# Patient Record
Sex: Male | Born: 2000 | Race: White | Hispanic: No | Marital: Single | Smoking: Never smoker
Health system: Southern US, Community
[De-identification: ages and names within clinical notes are randomized; demographics above are authoritative.]

## PROBLEM LIST (undated history)

## (undated) DIAGNOSIS — Z8489 Family history of other specified conditions: Secondary | ICD-10-CM

## (undated) DIAGNOSIS — Z969 Presence of functional implant, unspecified: Secondary | ICD-10-CM

## (undated) HISTORY — PX: WISDOM TOOTH EXTRACTION: SHX21

---
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
## 2021-09-06 ENCOUNTER — Encounter: Payer: Self-pay | Admitting: Family

## 2021-09-06 ENCOUNTER — Ambulatory Visit (INDEPENDENT_AMBULATORY_CARE_PROVIDER_SITE_OTHER): Payer: Self-pay | Admitting: Family

## 2021-09-06 VITALS — BP 113/72 | HR 60 | Temp 98.4°F | Ht 73.0 in | Wt 197.2 lb

## 2021-09-06 DIAGNOSIS — R21 Rash and other nonspecific skin eruption: Secondary | ICD-10-CM | POA: Insufficient documentation

## 2021-09-06 DIAGNOSIS — L729 Follicular cyst of the skin and subcutaneous tissue, unspecified: Secondary | ICD-10-CM

## 2021-09-06 NOTE — Patient Instructions (Signed)
Welcome to [facility], It was a pleasure meeting you today! ? ?As discussed, I feel confident the lump under your left arm is a benign fluid-filled cyst. These are not harmful. If you notice it growing, becoming tender, or discolored, let me know and I will refer you to Dermatology to have drained, or you can call a Dermatology office on your own if your insurance does not require a referral.  ? ? ?Have a great weekend :-) ? ? ? ? ? ?PLEASE NOTE: ?If you had any LAB tests please let us know if you have not heard back within a few days. You may see your results on MyChart before we have a chance to review them but we will give you a call once they are reviewed by [place]. If we ordered any REFERRALS today, please let us know if you have not heard from their office within the next week.  ?Let us know through MyChart if you are needing REFILLS, or have your pharmacy send [place] the request. You can also use MyChart to communicate with me or any office staff. ? ? ? ?

## 2021-09-06 NOTE — Progress Notes (Signed)
? ?  New Patient Office Visit ? ?Subjective:  ?Patient ID: Eugene Fox, male    DOB: 08/17/2000  Age: 21 y.o. MRN: [id number] ? ?CC:  ?Chief Complaint  ?Patient presents with  ? Establish Care  ? Rash  ?  Pt c/o rash under left arm. Present for about a year.   ? ? ?HPI ?Eugene Fox presents for establishing care today. ? ?Skin Lesion: Patient complains of a skin lesion of the upper extremity. The lesion has been present for 1 year. Lesion has not changed in size in that time. Symptoms associated with the lesion are: none. Patient denies increasing diameter, increasing number of lesions, darkening color, itching, bleeding, tendency to be traumatized, unsightliness, pain, or drainage.  ? ? ?Assessment & Plan:  ? ?Problem List Items Addressed This Visit   ?None ?Visit Diagnoses   ? ? Benign cyst of skin    -  Primary ?appears as a non-movable firm cyst in subcutaneous skin on underside of left upper arm. Not visible above skin, non-tender to palpation, no discoloration. Advised pt to monitor, and let me know if noticing it growing, becoming tender, or discolored, and I will refer you to Dermatology to have assessed/drained.  ? ?  ? ?No outpatient medications prior to visit.  ? ?No facility-administered medications prior to visit.  ? ? ?Past Medical History:  ?Diagnosis Date  ? Family history of adverse reaction to anesthesia   ? pt's mother has hx. of post-op nausea  ? Retained orthopedic hardware 08/2016  ? right ankle  ? ? ?Past Surgical History:  ?Procedure Laterality Date  ? HARDWARE REMOVAL Right 09/11/2016  ? Procedure: Right ankle removal of deep implants;  Surgeon: Wylene Simmer, MD;  Location: [facility];  Service: Orthopedics;  Laterality: Right;  ? ORIF TIBIA FRACTURE Right 05/22/2016  ? Procedure: OPEN REDUCTION INTERNAL FIXATION (ORIF) RIGHT TIBIAL PILON FRACTURE;  Surgeon: Wylene Simmer, MD;  Location: [place];  Service: Orthopedics;  Laterality: Right;  ? WISDOM TOOTH  EXTRACTION    ? ? ?Objective:  ? ?Today's Vitals: BP 113/72 (BP Location: Left Arm, Patient Position: Sitting, Cuff Size: Large)   Pulse 60   Temp 98.4 ?F (36.9 ?C) (Temporal)   Ht 6\' 1"  (1.854 m)   Wt 197 lb 3.2 oz (89.4 kg)   SpO2 94%   BMI 26.02 kg/m?  ? ?Physical Exam ?Vitals and nursing note reviewed.  ?Constitutional:   ?   General: He is not in acute distress. ?   Appearance: Normal appearance.  ?HENT:  ?   Head: Normocephalic.  ?Cardiovascular:  ?   Rate and Rhythm: Normal rate and regular rhythm.  ?Pulmonary:  ?   Effort: Pulmonary effort is normal.  ?   Breath sounds: Normal breath sounds.  ?Musculoskeletal:     ?   General: Normal range of motion.  ?   Cervical back: Normal range of motion.  ?Skin: ?   General: Skin is warm and dry.  ?   Findings: Lesion (nodule, approx. 1.5cm in diameter, fixed) present.  ? ?    ?Neurological:  ?   Mental Status: He is alert and oriented to person, place, and time.  ?Psychiatric:     ?   Mood and Affect: Mood normal.  ? ? ?No orders of the defined types were placed in this encounter. ? ? ?Follow-up: Return for any future concerns.  ? ?Eugene Sewer, NP ?

## 2022-12-11 ENCOUNTER — Telehealth: Payer: Self-pay | Admitting: Family

## 2022-12-11 NOTE — Telephone Encounter (Signed)
Patient last seen in 2023- left voicemail for patient to schedule annual physical
